# Patient Record
Sex: Female | Born: 1960 | Race: Black or African American | Hispanic: No | Marital: Married | Smoking: Never smoker
Health system: Southern US, Community
[De-identification: ages and names within clinical notes are randomized; demographics above are authoritative.]

## PROBLEM LIST (undated history)

## (undated) DIAGNOSIS — J45909 Unspecified asthma, uncomplicated: Secondary | ICD-10-CM

## (undated) HISTORY — PX: ABDOMINAL HYSTERECTOMY: SHX81

---

## 2016-04-17 ENCOUNTER — Emergency Department (HOSPITAL_COMMUNITY): Payer: No Typology Code available for payment source

## 2016-04-17 ENCOUNTER — Emergency Department (HOSPITAL_COMMUNITY)
Admission: EM | Admit: 2016-04-17 | Discharge: 2016-04-17 | Disposition: A | Discharge status: Home / Self Care | Payer: No Typology Code available for payment source | Attending: Emergency Medicine | Admitting: Emergency Medicine

## 2016-04-17 ENCOUNTER — Encounter (HOSPITAL_COMMUNITY): Payer: Self-pay

## 2016-04-17 DIAGNOSIS — R42 Dizziness and giddiness: Secondary | ICD-10-CM | POA: Diagnosis not present

## 2016-04-17 DIAGNOSIS — J45909 Unspecified asthma, uncomplicated: Secondary | ICD-10-CM | POA: Insufficient documentation

## 2016-04-17 HISTORY — DX: Unspecified asthma, uncomplicated: J45.909

## 2016-04-17 LAB — CBC WITH DIFFERENTIAL/PLATELET
BASOS ABS: 0 10*3/uL (ref 0.0–0.1)
Basophils Relative: 0 %
Eosinophils Absolute: 0 10*3/uL (ref 0.0–0.7)
Eosinophils Relative: 1 %
HEMATOCRIT: 40.7 % (ref 36.0–46.0)
Hemoglobin: 12.9 g/dL (ref 12.0–15.0)
LYMPHS ABS: 1.3 10*3/uL (ref 0.7–4.0)
LYMPHS PCT: 17 %
MCH: 28.1 pg (ref 26.0–34.0)
MCHC: 31.7 g/dL (ref 30.0–36.0)
MCV: 88.7 fL (ref 78.0–100.0)
MONO ABS: 1.1 10*3/uL — AB (ref 0.1–1.0)
Monocytes Relative: 14 %
NEUTROS ABS: 5.5 10*3/uL (ref 1.7–7.7)
Neutrophils Relative %: 68 %
Platelets: 280 10*3/uL (ref 150–400)
RBC: 4.59 MIL/uL (ref 3.87–5.11)
RDW: 14.2 % (ref 11.5–15.5)
WBC: 8 10*3/uL (ref 4.0–10.5)

## 2016-04-17 LAB — COMPREHENSIVE METABOLIC PANEL
ALBUMIN: 3.1 g/dL — AB (ref 3.5–5.0)
ALT: 19 U/L (ref 14–54)
AST: 19 U/L (ref 15–41)
Alkaline Phosphatase: 64 U/L (ref 38–126)
Anion gap: 4 — ABNORMAL LOW (ref 5–15)
CHLORIDE: 108 mmol/L (ref 101–111)
CO2: 24 mmol/L (ref 22–32)
Calcium: 8.5 mg/dL — ABNORMAL LOW (ref 8.9–10.3)
Creatinine, Ser: 0.7 mg/dL (ref 0.44–1.00)
GFR calc Af Amer: >60 mL/min (ref >60)
GFR calc non Af Amer: >60 mL/min (ref >60)
GLUCOSE: 95 mg/dL (ref 65–99)
POTASSIUM: 3.7 mmol/L (ref 3.5–5.1)
SODIUM: 136 mmol/L (ref 135–145)
Total Bilirubin: 0.6 mg/dL (ref 0.3–1.2)
Total Protein: 6.4 g/dL — ABNORMAL LOW (ref 6.5–8.1)

## 2016-04-17 LAB — I-STAT CG4 LACTIC ACID, ED: LACTIC ACID, VENOUS: 1.31 mmol/L (ref 0.5–1.9)

## 2016-04-17 LAB — I-STAT TROPONIN, ED: Troponin i, poc: 0.01 ng/mL (ref 0.00–0.08)

## 2016-04-17 MED ORDER — ACETAMINOPHEN 325 MG PO TABS
650.0000 mg | ORAL_TABLET | Freq: Once | ORAL | Status: AC | PRN
  Administered 2016-04-17: 650 mg via ORAL

## 2016-04-17 MED ORDER — ACETAMINOPHEN 325 MG PO TABS
ORAL_TABLET | ORAL | Status: AC
  Filled 2016-04-17: qty 2

## 2016-04-17 NOTE — ED Provider Notes (Signed)
Emergency Department Provider Note   I have reviewed the triage vital signs and the nursing notes.   HISTORY  Chief Complaint Dizziness   HPI Crystal Sparks is a 55 y.o. female with PMH of asthma presents to the emergency department for evaluation of lightheadedness this morning. The patient states that 4 days ago she ate some shellfish which may been allergic to. Approximately 12 hours later she had some itching and scratchy/sore throat. She had several episodes of clear vomiting. [place] to visit friends and had 2 episodes yesterday where she had difficulty breathing back cleared with coughing up these clear secretions. Today she has felt generally well but had some lightheadedness and mild headache this morning. Her family became concerned and insisted that she presented to the emergency department for evaluation. The patient had a fever in triage but states that she is overall feeling fine. She's never had a history of blood clots in her legs or lungs. She does not take estrogen containing medications. She denies any throat discomfort for the past 2 days. She denies any difficulty breathing. No abdominal discomfort.   Past Medical History:  Diagnosis Date  . Asthma     There are no active problems to display for this patient.   Past Surgical History:  Procedure Laterality Date  . ABDOMINAL HYSTERECTOMY        Allergies Shellfish allergy  No family history on file.  Social History Social History  Substance Use Topics  . Smoking status: Never Smoker  . Smokeless tobacco: Never Used  . Alcohol use No    Review of Systems  Constitutional: No chills. Positive fever.  Eyes: No visual changes. ENT: Positive sore throat 2 days prior. Cardiovascular: Denies chest pain. Respiratory: Positive shortness of breath yesterday. Gastrointestinal: No abdominal pain.  No nausea, no vomiting.  No diarrhea.  No constipation. Genitourinary:  Negative for dysuria. Musculoskeletal: Negative for back pain. Skin: Negative for rash. Neurological: Negative for headaches, focal weakness or numbness.  10-point ROS otherwise negative.  ____________________________________________   PHYSICAL EXAM:  VITAL SIGNS: ED Triage Vitals  Enc Vitals Group     BP 04/17/16 1244 143/77     Pulse Rate 04/17/16 1244 110     Resp 04/17/16 1244 17     Temp 04/17/16 1244 101.6 F (38.7 C)     Temp Source 04/17/16 1244 Oral     SpO2 04/17/16 1244 98 %   Constitutional: Alert and oriented. Well appearing and in no acute distress. Eyes: Conjunctivae are normal.  Head: Atraumatic. Nose: No congestion/rhinnorhea. Mouth/Throat: Mucous membranes are moist.   Neck: No stridor.  No meningeal signs.  Cardiovascular: Sinus tachycardia. Good peripheral circulation. Grossly normal heart sounds.   Respiratory: Normal respiratory effort.  No retractions. Lungs CTAB. Gastrointestinal: Soft and nontender. No distention.  Musculoskeletal: No lower extremity tenderness nor edema. No gross deformities of extremities. Neurologic:  Normal speech and language. No gross focal neurologic deficits are appreciated.  Skin:  Skin is warm, dry and intact. No rash noted. Psychiatric: Mood and affect are normal. Speech and behavior are normal.  ____________________________________________   LABS (all labs ordered are listed, but only abnormal results are displayed)  Labs Reviewed  COMPREHENSIVE METABOLIC PANEL - Abnormal; Notable for the following:       Result Value   BUN <5 (*)    Calcium 8.5 (*)    Total Protein 6.4 (*)    Albumin 3.1 (*)  Anion gap 4 (*)    All other components within normal limits  CBC WITH DIFFERENTIAL/PLATELET - Abnormal; Notable for the following:    Monocytes Absolute 1.1 (*)    All other components within normal limits  URINE CULTURE  URINALYSIS, ROUTINE W REFLEX MICROSCOPIC (NOT AT [facility])  I-STAT CG4 LACTIC ACID, ED  I-STAT  TROPOININ, ED  I-STAT CG4 LACTIC ACID, ED   ____________________________________________  EKG   EKG Interpretation  Date/Time:  Friday April 17 2016 12:43:08 EDT Ventricular Rate:  110 PR Interval:  138 QRS Duration: 74 QT Interval:  334 QTC Calculation: 452 R Axis:   17 Text Interpretation:  Sinus tachycardia Otherwise normal ECG No STEMI. No old tracing for comparison.  Confirmed by LONG MD, JOSHUA [phone]) on 04/17/2016 4:26:54 PM      ____________________________________________  RADIOLOGY  Dg Chest 2 View  Result Date: 04/17/2016 CLINICAL DATA:  Dizziness EXAM: CHEST  2 VIEW COMPARISON:  None. FINDINGS: Low volume chest with streaky basilar opacities. There is no edema, consolidation, effusion, or pneumothorax. Normal heart size and mediastinal contours. Exaggerated thoracic kyphosis with spondylosis and disc narrowing. IMPRESSION: Low volume chest with mild basilar atelectasis. Electronically Signed   By: Marnee Spring M.D.   On: 04/17/2016 13:07    ____________________________________________   PROCEDURES  Procedure(s) performed:   Procedures  None ____________________________________________   INITIAL IMPRESSION / ASSESSMENT AND PLAN / ED COURSE  Pertinent labs & imaging results that were available during my care of the patient were reviewed by me and considered in my medical decision making (see chart for details).  Patient resents to the emergency department for evaluation of lightheadedness. She had some vague associated symptoms earlier in the week that is since resolved. No vertigo. The patient has an intact neurological exam. She is low risk for pulmonary embolism on well's and her heart rate has decreased since arrival without acute intervention. Unclear source of fever. The patient has a chest x-ray not concerning for pneumonia or other infectious process. Patient states she is "ready to go" during my evaluation. I did offer IV fluids but she would  like to defer this and return home. She'll follow with her primary care physician early next week. Discussed return precautions in detail.  At this time, I do not feel there is any life-threatening condition present. I have reviewed and discussed all results (EKG, imaging, lab, urine as appropriate), exam findings with patient. I have reviewed nursing notes and appropriate previous records.  I feel the patient is safe to be discharged home without further emergent workup. Discussed usual and customary return precautions. Patient and family (if present) verbalize understanding and are comfortable with this plan.  Patient will follow-up with their primary care provider. If they do not have a primary care provider, information for follow-up has been provided to them. All questions have been answered.    ____________________________________________  FINAL CLINICAL IMPRESSION(S) / ED DIAGNOSES  Final diagnoses:  Dizziness     MEDICATIONS GIVEN DURING THIS VISIT:  Medications  acetaminophen (TYLENOL) 325 MG tablet (not administered)  acetaminophen (TYLENOL) tablet 650 mg (650 mg Oral Given 04/17/16 1248)     NEW OUTPATIENT MEDICATIONS STARTED DURING THIS VISIT:  None   Note:  This document was prepared using Dragon voice recognition software and may include unintentional dictation errors.  Alona Bene, MD Emergency Medicine   Maia Plan, MD 04/17/16 [phone]

## 2016-04-17 NOTE — ED Triage Notes (Signed)
Pt here with c/o dizziness and lightheadedness that started last week but worsened yesterday while she was on a bus from [place] 11[place]5 hours. Pt denies LOC, has not passed out.  A&OX4. Pt speaking clear complete sentences.

## 2016-04-17 NOTE — Discharge Instructions (Signed)
You have been seen in the ED today with lightheadedness and difficulty breathing. We evaluated you closely and did not find a clear reason for your symptoms. You had a fever but we did not see a clear source.   You should follow up with your PCP when you return to [place]. Return to the nearest ED sooner if you develop any chest pain, difficulty breathing, throat tightness, weakness, or passing out.

## 2017-11-18 IMAGING — DX DG CHEST 2V
2 series · 2 of 2 positions shown · non-contrast
Comparison: None.

CLINICAL DATA: Dizziness

EXAM:
CHEST  2 VIEW

[chest lat]
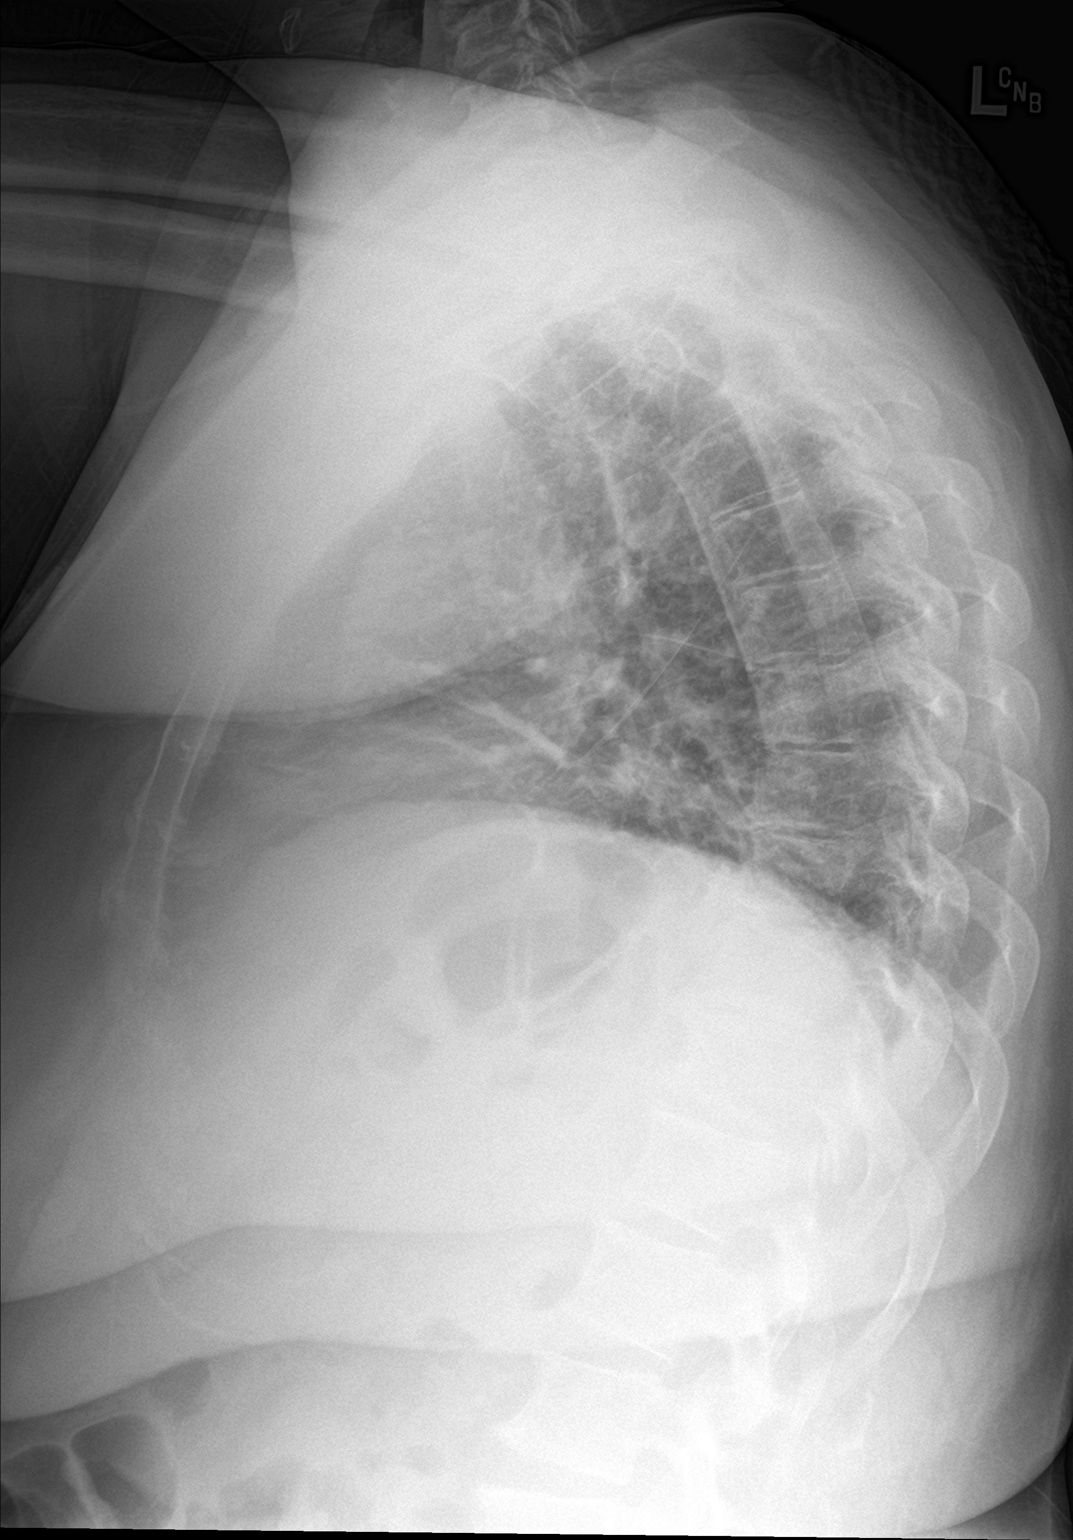

[chest pa]
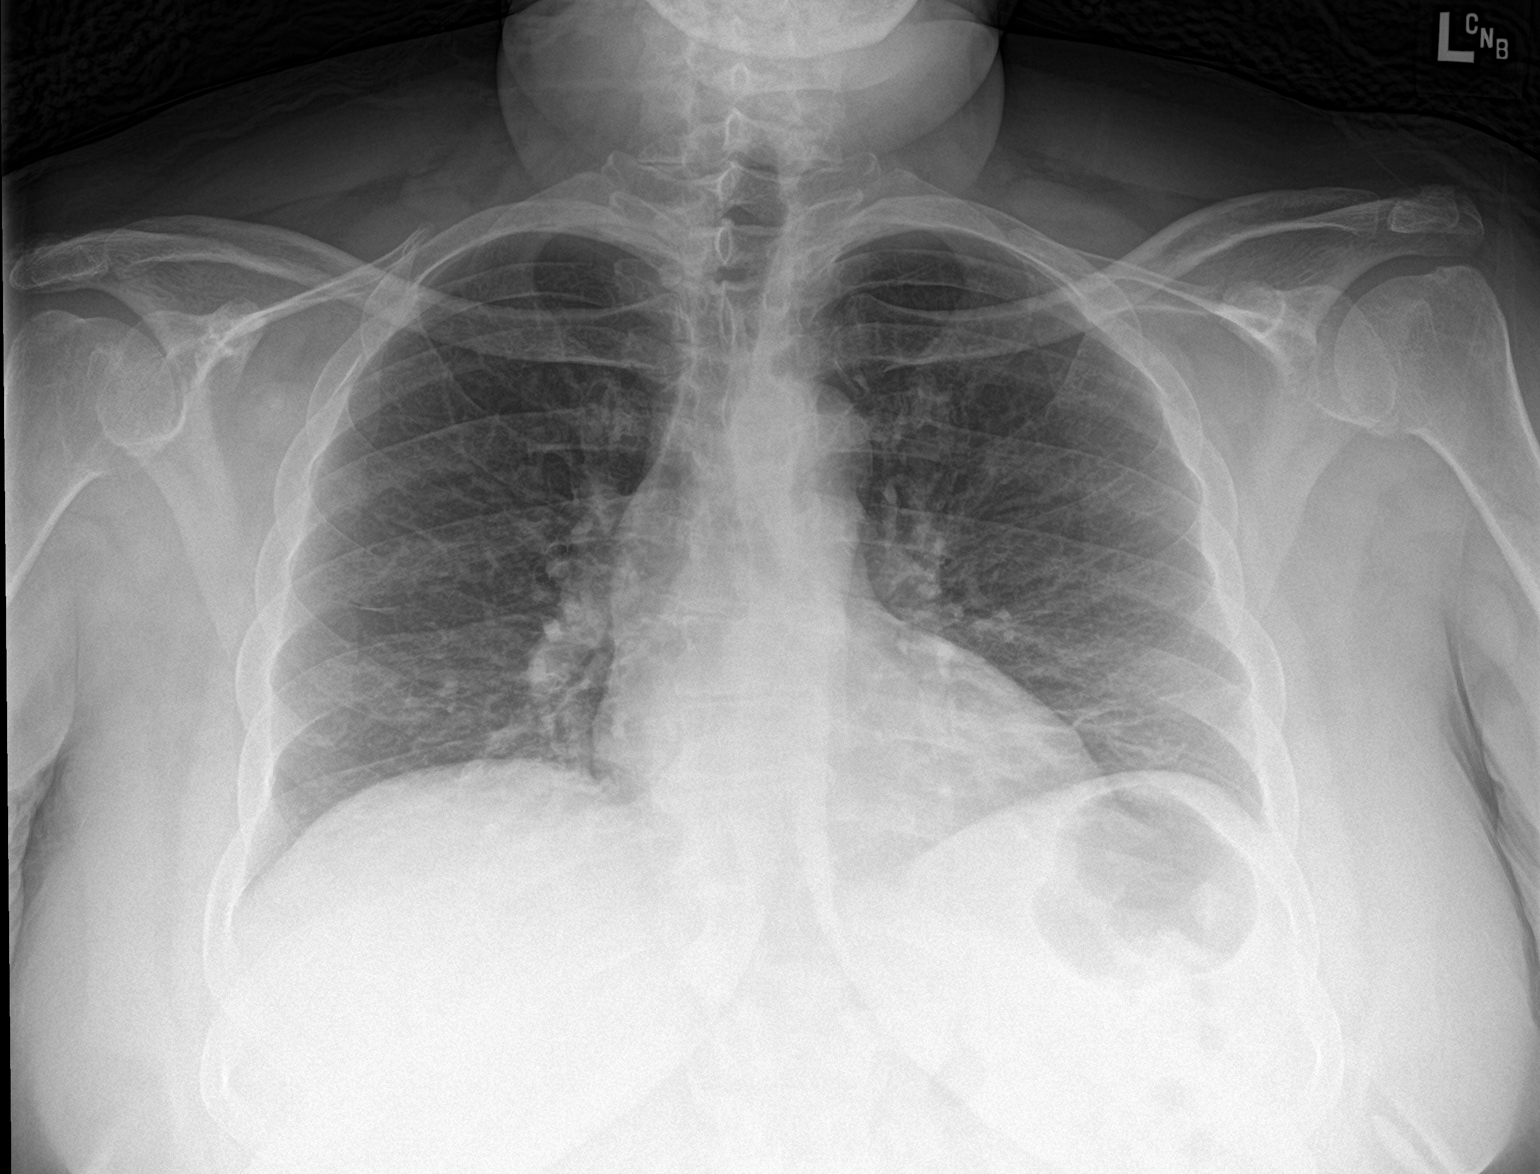

[2 of 2 positions shown; findings below may reference images not displayed]

FINDINGS: Low volume chest with streaky basilar opacities. There is no edema,
consolidation, effusion, or pneumothorax. Normal heart size and
mediastinal contours. Exaggerated thoracic kyphosis with spondylosis
and disc narrowing.
IMPRESSION: Low volume chest with mild basilar atelectasis.
